# Patient Record
Sex: Female | Born: 1995 | Hispanic: Yes | Marital: Married | Smoking: Never smoker
Health system: Southern US, Community
[De-identification: ages and names within clinical notes are randomized; demographics above are authoritative.]

## PROBLEM LIST (undated history)

## (undated) ENCOUNTER — Inpatient Hospital Stay: Payer: Self-pay

## (undated) DIAGNOSIS — O99210 Obesity complicating pregnancy, unspecified trimester: Secondary | ICD-10-CM

## (undated) DIAGNOSIS — N83209 Unspecified ovarian cyst, unspecified side: Secondary | ICD-10-CM

## (undated) HISTORY — PX: TYMPANOSTOMY TUBE PLACEMENT: SHX32

---
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
## 2015-01-27 ENCOUNTER — Ambulatory Visit
Admission: RE | Admit: 2015-01-27 | Discharge: 2015-01-27 | Disposition: A | Discharge status: Home / Self Care | Payer: No Typology Code available for payment source | Source: Ambulatory Visit | Attending: Physician Assistant | Admitting: Physician Assistant

## 2015-01-27 ENCOUNTER — Other Ambulatory Visit: Payer: Self-pay | Admitting: Physician Assistant

## 2015-01-27 DIAGNOSIS — Z3A19 19 weeks gestation of pregnancy: Secondary | ICD-10-CM | POA: Insufficient documentation

## 2015-01-27 DIAGNOSIS — O368920 Maternal care for other specified fetal problems, second trimester, not applicable or unspecified: Secondary | ICD-10-CM | POA: Insufficient documentation

## 2015-01-27 DIAGNOSIS — Z3402 Encounter for supervision of normal first pregnancy, second trimester: Secondary | ICD-10-CM

## 2015-01-27 DIAGNOSIS — Z3490 Encounter for supervision of normal pregnancy, unspecified, unspecified trimester: Secondary | ICD-10-CM

## 2015-01-27 DIAGNOSIS — Z36 Encounter for antenatal screening of mother: Secondary | ICD-10-CM | POA: Diagnosis present

## 2015-03-16 ENCOUNTER — Observation Stay
Admission: EM | Admit: 2015-03-16 | Discharge: 2015-03-16 | Disposition: A | Discharge status: Home / Self Care | Payer: No Typology Code available for payment source | Attending: Obstetrics and Gynecology | Admitting: Obstetrics and Gynecology

## 2015-03-16 ENCOUNTER — Observation Stay: Payer: No Typology Code available for payment source

## 2015-03-16 ENCOUNTER — Encounter: Payer: Self-pay | Admitting: *Deleted

## 2015-03-16 DIAGNOSIS — O26899 Other specified pregnancy related conditions, unspecified trimester: Secondary | ICD-10-CM

## 2015-03-16 DIAGNOSIS — R102 Pelvic and perineal pain: Secondary | ICD-10-CM

## 2015-03-16 DIAGNOSIS — R109 Unspecified abdominal pain: Secondary | ICD-10-CM | POA: Diagnosis present

## 2015-03-16 DIAGNOSIS — O26892 Other specified pregnancy related conditions, second trimester: Principal | ICD-10-CM | POA: Insufficient documentation

## 2015-03-16 HISTORY — DX: Unspecified ovarian cyst, unspecified side: N83.209

## 2015-03-16 LAB — URINALYSIS COMPLETE WITH MICROSCOPIC (ARMC ONLY)
BILIRUBIN URINE: NEGATIVE
Bacteria, UA: NONE SEEN
GLUCOSE, UA: 50 mg/dL — AB
HGB URINE DIPSTICK: NEGATIVE
Ketones, ur: NEGATIVE mg/dL
LEUKOCYTES UA: NEGATIVE
Nitrite: NEGATIVE
Protein, ur: NEGATIVE mg/dL
SPECIFIC GRAVITY, URINE: 1.009 (ref 1.005–1.030)
pH: 7 (ref 5.0–8.0)

## 2015-03-16 LAB — CBC
HCT: 36 % (ref 35.0–47.0)
Hemoglobin: 12.1 g/dL (ref 12.0–16.0)
MCH: 30.7 pg (ref 26.0–34.0)
MCHC: 33.7 g/dL (ref 32.0–36.0)
MCV: 91.1 fL (ref 80.0–100.0)
Platelets: 344 10*3/uL (ref 150–440)
RBC: 3.95 MIL/uL (ref 3.80–5.20)
RDW: 13.4 % (ref 11.5–14.5)
WBC: 14.4 10*3/uL — ABNORMAL HIGH (ref 3.6–11.0)

## 2015-03-16 LAB — BASIC METABOLIC PANEL
ANION GAP: 8 (ref 5–15)
BUN: 7 mg/dL (ref 6–20)
CALCIUM: 8.8 mg/dL — AB (ref 8.9–10.3)
CHLORIDE: 107 mmol/L (ref 101–111)
CO2: 22 mmol/L (ref 22–32)
Creatinine, Ser: 0.39 mg/dL — ABNORMAL LOW (ref 0.44–1.00)
Glucose, Bld: 78 mg/dL (ref 65–99)
Potassium: 3.6 mmol/L (ref 3.5–5.1)
SODIUM: 137 mmol/L (ref 135–145)

## 2015-03-16 MED ORDER — LACTATED RINGERS IV BOLUS (SEPSIS)
1000.0000 mL | Freq: Once | INTRAVENOUS | Status: AC
  Administered 2015-03-16: 1000 mL via INTRAVENOUS

## 2015-03-16 NOTE — OB Triage Note (Signed)
Complaining of left sided lower back pain, that started 03/14/2015 in the AM. Pain not relieved by Tylenol or position change. Laying on left side aggravates pain.

## 2015-03-16 NOTE — Progress Notes (Signed)
Reviewed discharge instructions with patient and family all verbalized understanding. Prescriptions given to patient. Ambulatory up discharge.

## 2015-03-16 NOTE — Progress Notes (Signed)
Kristen Sawyer [birth year] G1 P0 [redacted]w[redacted]d presents for 3 day h/o lower abd pain  . Left > right . + cramping . NO N/V / fever . No vaginal bleeding . Some dysuria  No LOF , no vaginal bleeding , PMHX none  pshx none  Ros ; unremarkable  Allergies NKDA MEds : pnv  Social no tobacco , no etoh  O;BP 128/78 mmHg  Pulse 116  Temp(Src) 97.8 F (36.6 C) (Oral)  Resp 16  LMP 09/12/2014 ABD soft non tender , no rebound  CtX none  cx : long closed  NST, normal appearing for 26 week , no decels, no ctx  Labs: wbc 14K , rest of cbc nl  ua : negative   metb - wnl   U/s pelvic :   A: lower abd pain  P:pelvic u/s today  If normal d/c home with norco 1 po q 6 hrs .  Follow up with [facility]

## 2015-03-16 NOTE — Discharge Instructions (Signed)

## 2015-03-18 LAB — URINE CULTURE

## 2015-04-29 ENCOUNTER — Other Ambulatory Visit: Payer: Self-pay | Admitting: Physician Assistant

## 2015-04-29 DIAGNOSIS — Z3403 Encounter for supervision of normal first pregnancy, third trimester: Secondary | ICD-10-CM

## 2015-04-29 DIAGNOSIS — O35EXX0 Maternal care for other (suspected) fetal abnormality and damage, fetal genitourinary anomalies, not applicable or unspecified: Secondary | ICD-10-CM

## 2015-04-29 DIAGNOSIS — O358XX0 Maternal care for other (suspected) fetal abnormality and damage, not applicable or unspecified: Secondary | ICD-10-CM

## 2015-05-05 ENCOUNTER — Ambulatory Visit
Admission: RE | Admit: 2015-05-05 | Discharge: 2015-05-05 | Disposition: A | Discharge status: Home / Self Care | Payer: No Typology Code available for payment source | Source: Ambulatory Visit | Attending: Physician Assistant | Admitting: Physician Assistant

## 2015-05-05 DIAGNOSIS — O358XX0 Maternal care for other (suspected) fetal abnormality and damage, not applicable or unspecified: Secondary | ICD-10-CM

## 2015-05-05 DIAGNOSIS — Z3403 Encounter for supervision of normal first pregnancy, third trimester: Secondary | ICD-10-CM | POA: Diagnosis present

## 2015-05-05 DIAGNOSIS — O35EXX0 Maternal care for other (suspected) fetal abnormality and damage, fetal genitourinary anomalies, not applicable or unspecified: Secondary | ICD-10-CM

## 2015-05-07 ENCOUNTER — Other Ambulatory Visit: Payer: Self-pay | Admitting: Physician Assistant

## 2015-05-07 DIAGNOSIS — IMO0002 Reserved for concepts with insufficient information to code with codable children: Secondary | ICD-10-CM

## 2015-05-07 DIAGNOSIS — N1330 Unspecified hydronephrosis: Secondary | ICD-10-CM

## 2015-05-20 ENCOUNTER — Ambulatory Visit
Admission: RE | Admit: 2015-05-20 | Discharge: 2015-05-20 | Disposition: A | Discharge status: Home / Self Care | Payer: No Typology Code available for payment source | Source: Ambulatory Visit | Attending: Physician Assistant | Admitting: Physician Assistant

## 2015-05-20 VITALS — BP 130/83 | HR 109 | Temp 97.9°F | Resp 18 | Ht 60.0 in | Wt 167.4 lb

## 2015-05-20 DIAGNOSIS — IMO0001 Reserved for inherently not codable concepts without codable children: Secondary | ICD-10-CM

## 2015-05-20 DIAGNOSIS — Z3A35 35 weeks gestation of pregnancy: Secondary | ICD-10-CM

## 2015-05-20 DIAGNOSIS — O359XX0 Maternal care for (suspected) fetal abnormality and damage, unspecified, not applicable or unspecified: Secondary | ICD-10-CM | POA: Diagnosis present

## 2015-05-20 DIAGNOSIS — N1330 Unspecified hydronephrosis: Secondary | ICD-10-CM

## 2015-05-20 DIAGNOSIS — O358XX0 Maternal care for other (suspected) fetal abnormality and damage, not applicable or unspecified: Secondary | ICD-10-CM

## 2015-05-20 DIAGNOSIS — IMO0002 Reserved for concepts with insufficient information to code with codable children: Secondary | ICD-10-CM

## 2015-05-20 HISTORY — DX: Obesity complicating pregnancy, unspecified trimester: O99.210

## 2016-01-19 ENCOUNTER — Encounter (HOSPITAL_COMMUNITY): Payer: Self-pay | Admitting: *Deleted

## 2016-03-22 IMAGING — US US OB LIMITED
1 series · 14 of 28 positions shown · non-contrast
Comparison: none

CLINICAL DATA: Abdominal pain affecting pregnancy. Assigned
gestational age of 26 weeks 3 days .

EXAM:
LIMITED OBSTETRIC ULTRASOUND

[Series 1: us ob limited · 0.27mm/px · 14 of 48 slices shown]
[im 2/48]
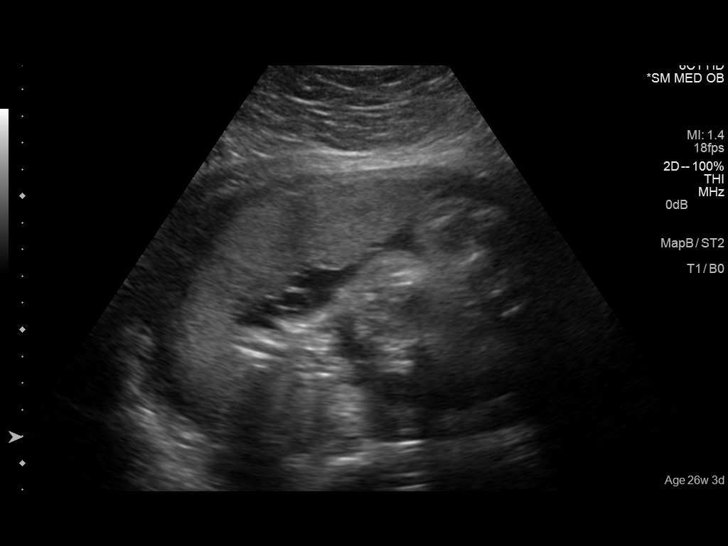
[im 6/48]
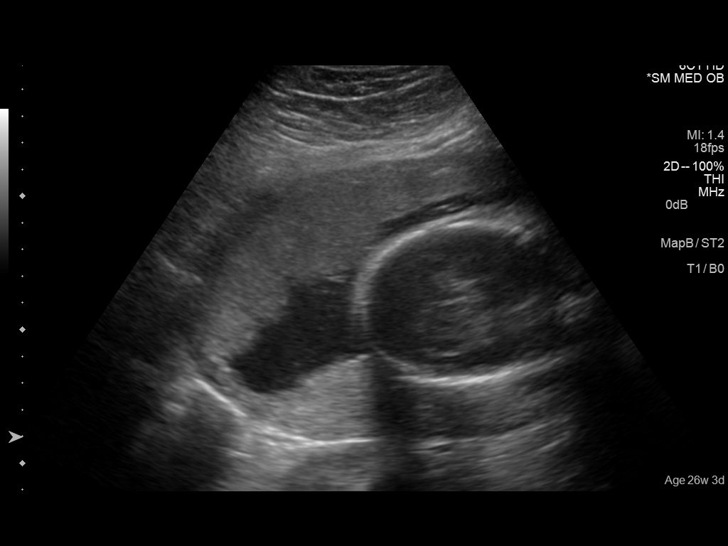
[im 9/48]
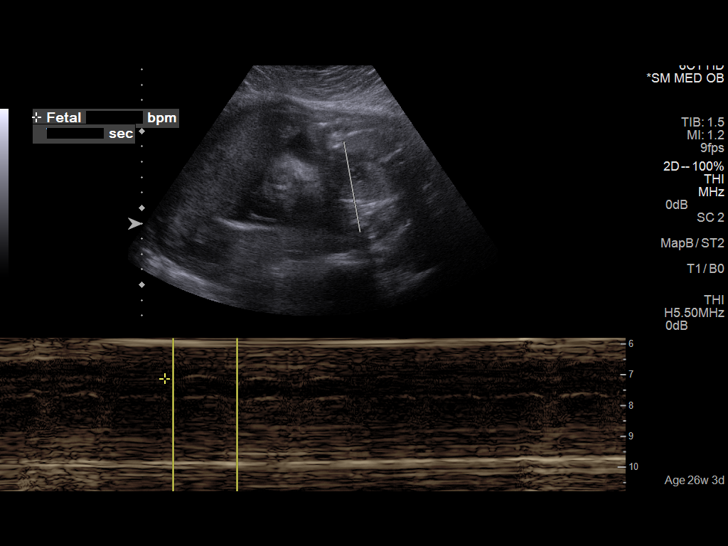
[im 13/48]
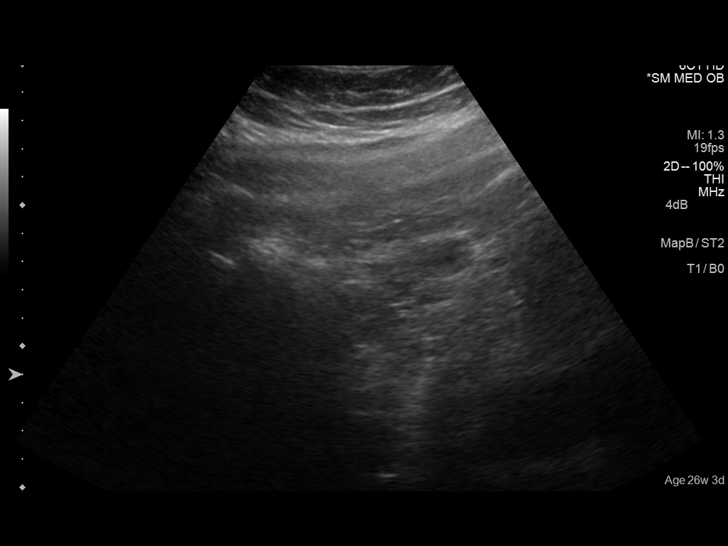
[im 16/48]
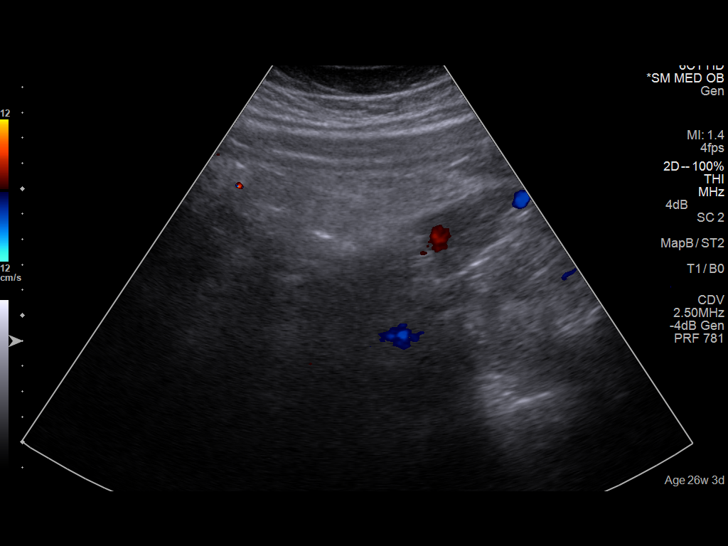
[im 20/48]
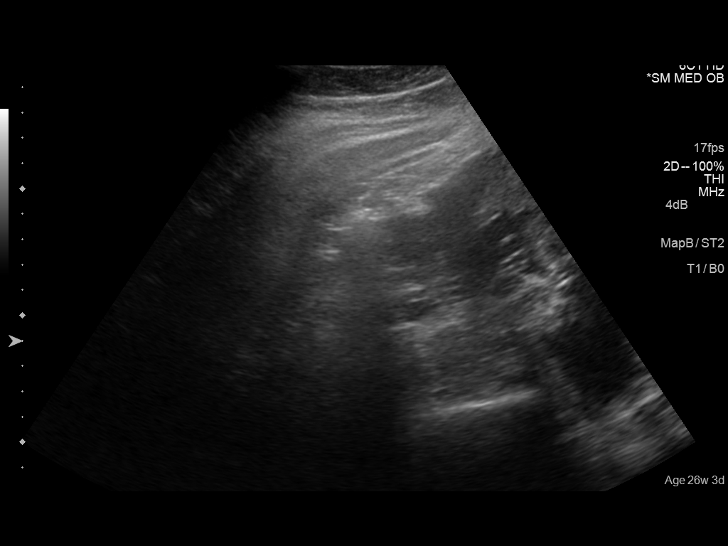
[im 23/48]
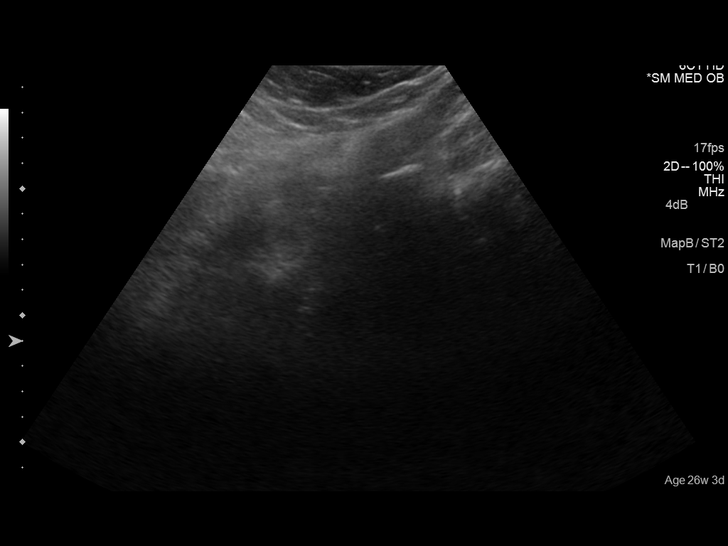
[im 27/48]
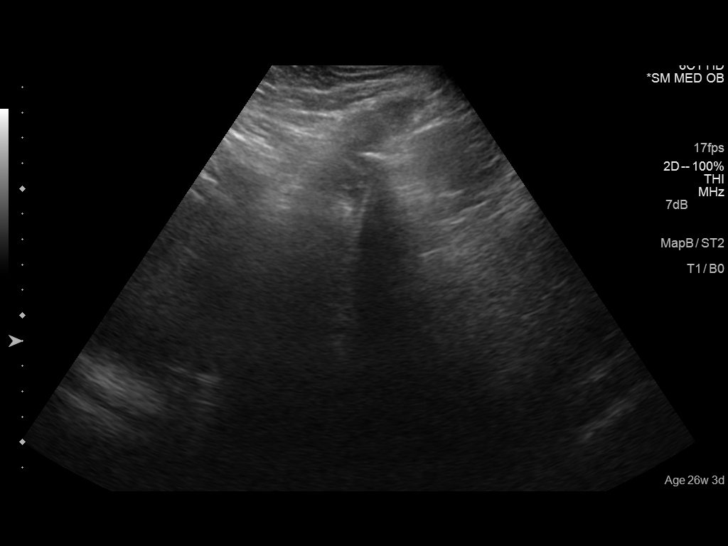
[im 30/48]
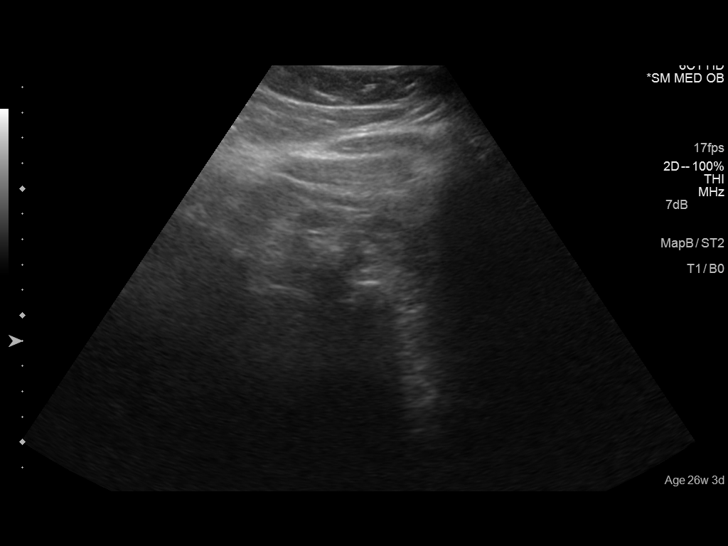
[im 34/48]
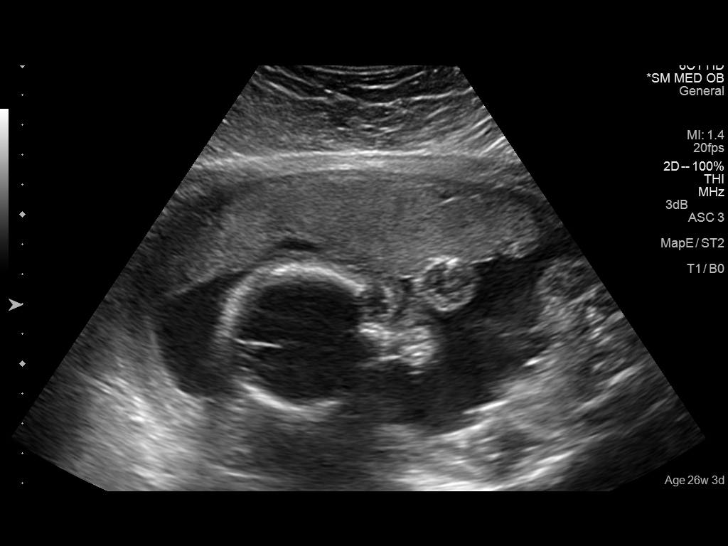
[im 37/48]
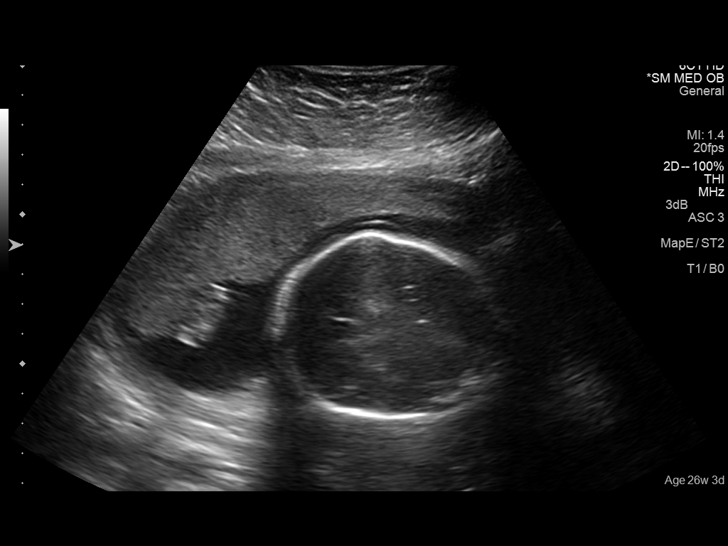
[im 41/48]
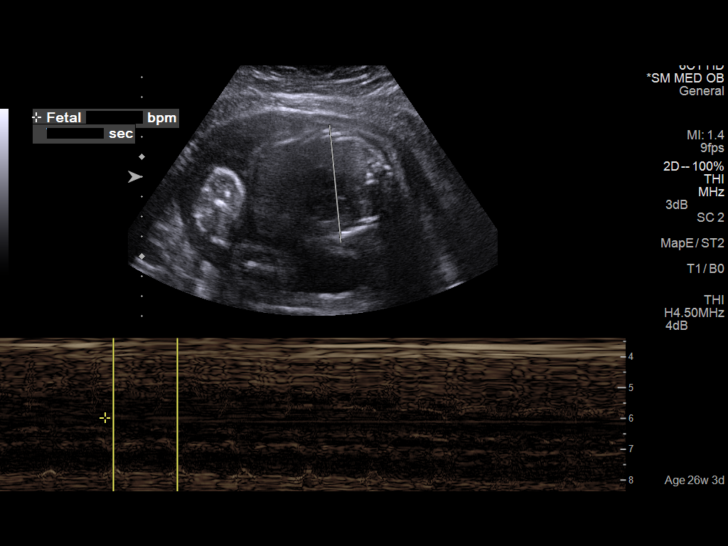
[im 44/48]
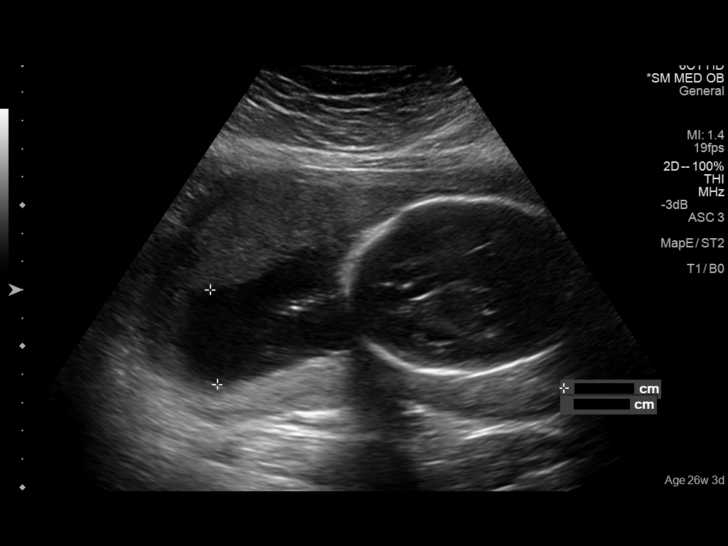
[im 48/48]
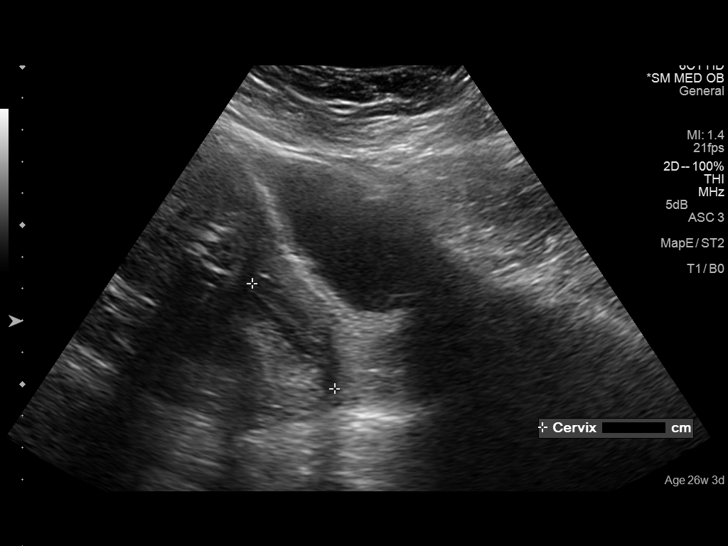

[14 of 28 positions shown; findings below may reference images not displayed]

FINDINGS: Number of Fetuses: 1

Heart Rate:  160 bpm

Movement: Yes

Presentation: Breech

Placental Location: Anterior

Previa: No

Amniotic Fluid (Subjective):  Within normal limits.

BPD:  6.0cm 24w  3d

MATERNAL FINDINGS:

Cervix:  Appears closed.  Measures 4.2 cm in length.

Uterus/Adnexae:  No abnormality visualized.
IMPRESSION: Single living intrauterine fetus in breech presentation. No acute
maternal findings visualized.

This exam is performed on an emergent basis and does not
comprehensively evaluate fetal size, dating, or anatomy; follow-up
complete OB US should be considered if further fetal assessment is
warranted.

## 2016-05-26 IMAGING — US US MFM OB FOLLOW-UP
1 series · 14 of 28 positions shown · non-contrast
Comparison: none

[Series 1: us mfm ob follow-up · 0.31mm/px · 14 of 50 slices shown]
[im 2/50]
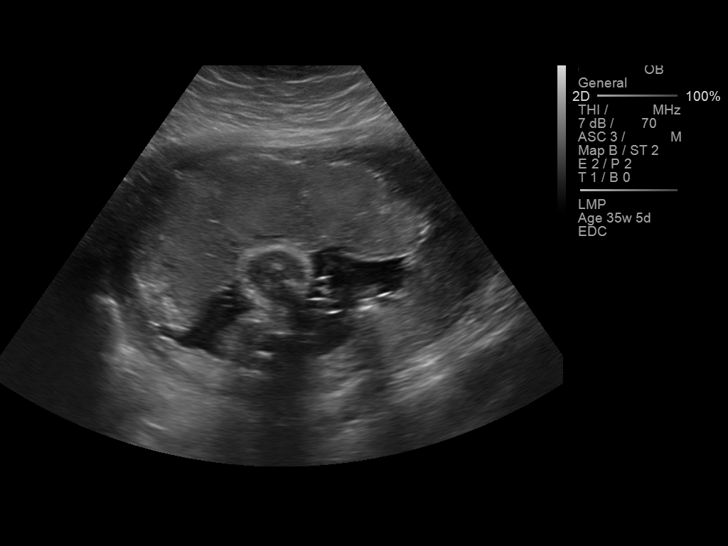
[im 6/50]
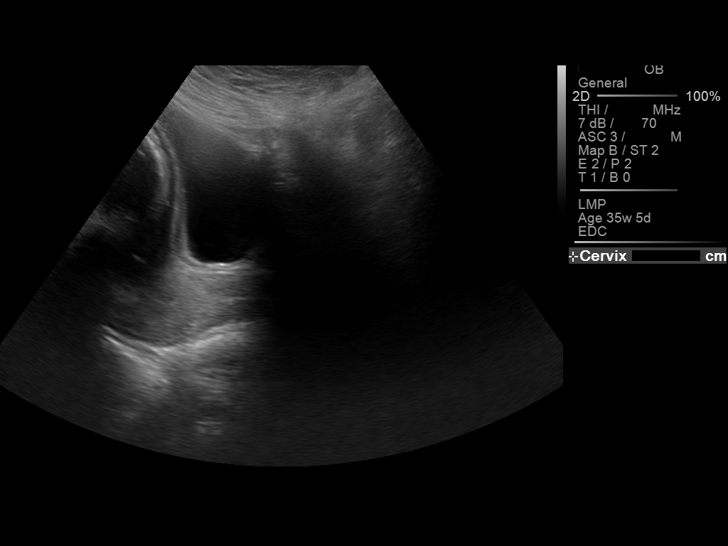
[im 10/50]
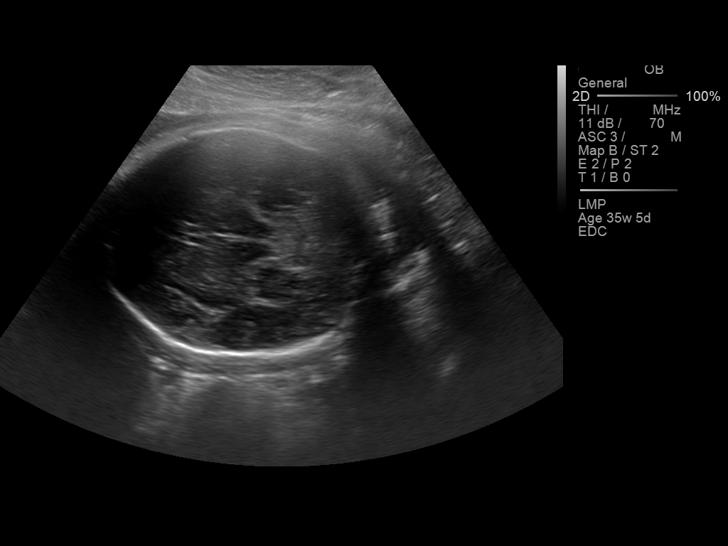
[im 13/50]
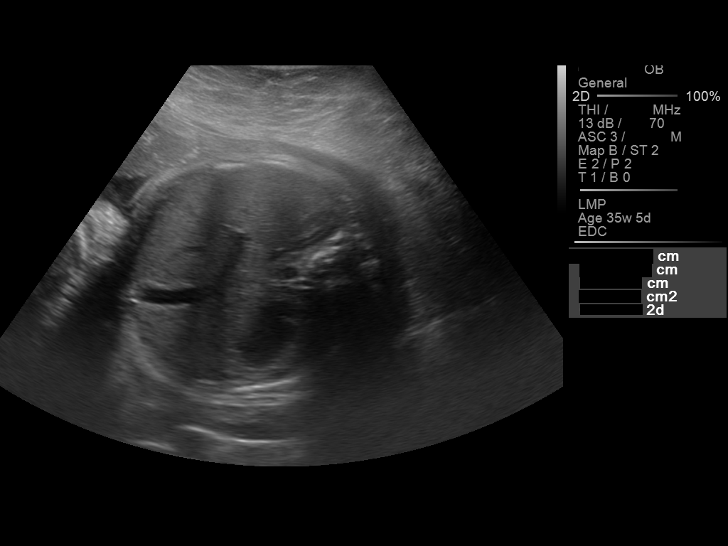
[im 17/50]
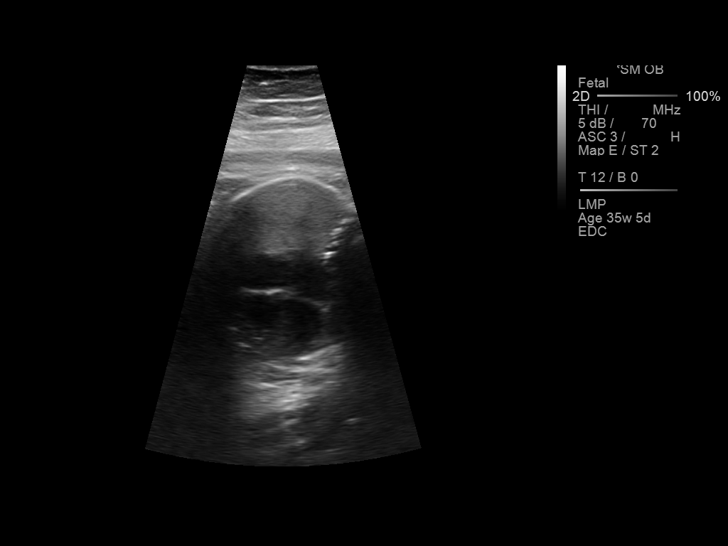
[im 20/50]
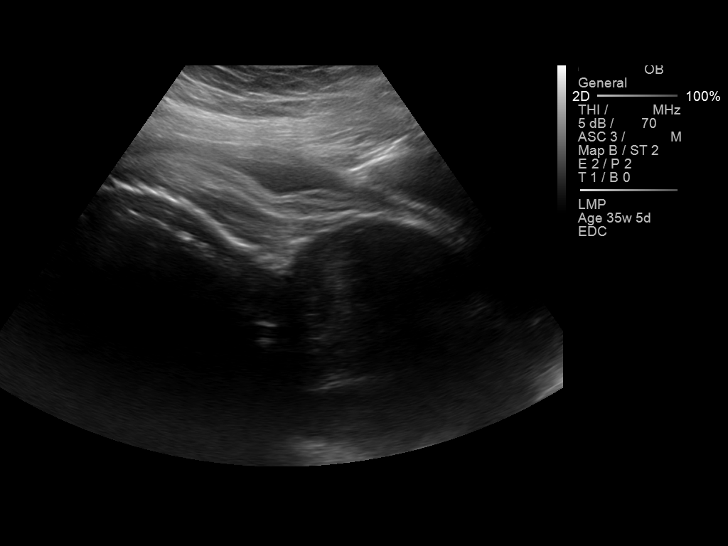
[im 24/50]
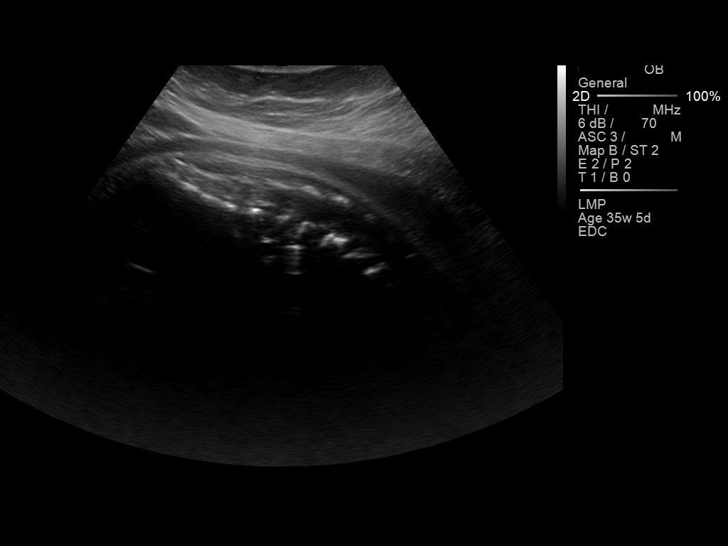
[im 28/50]
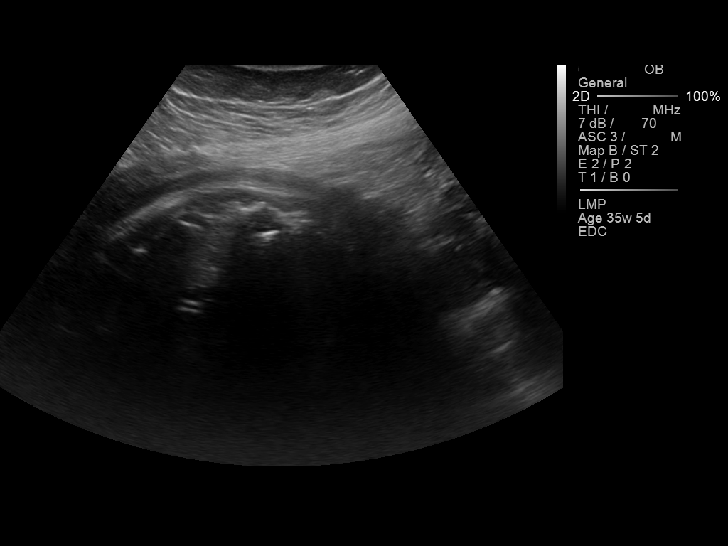
[im 31/50]
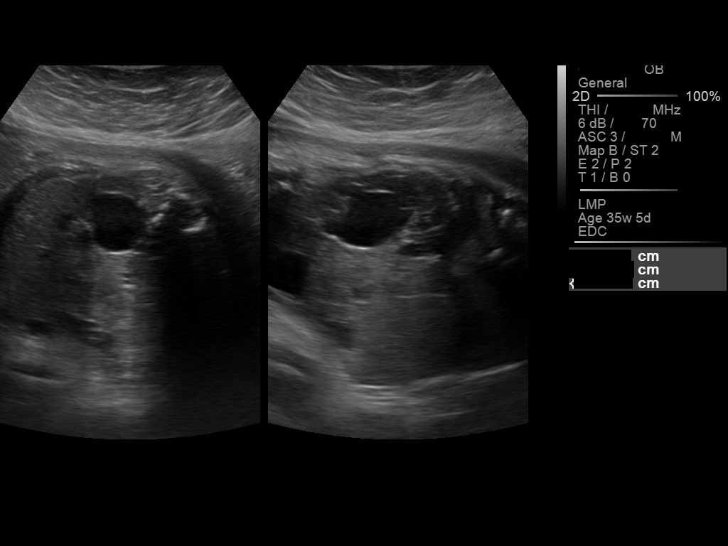
[im 35/50]
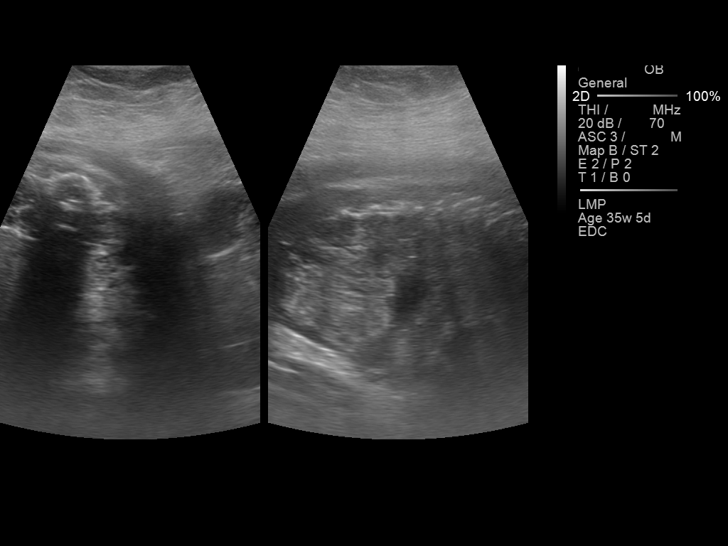
[im 39/50]
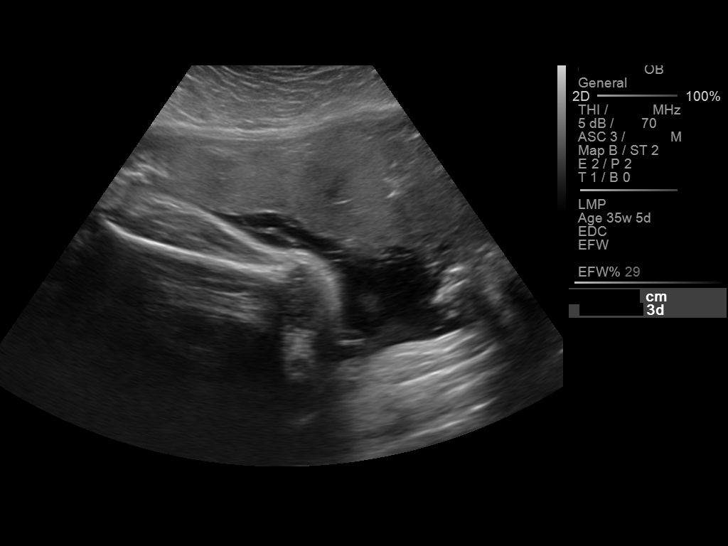
[im 42/50]
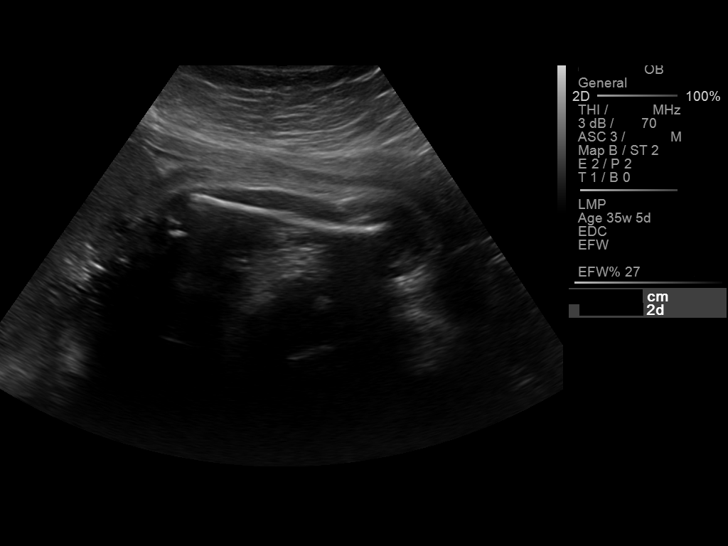
[im 46/50]
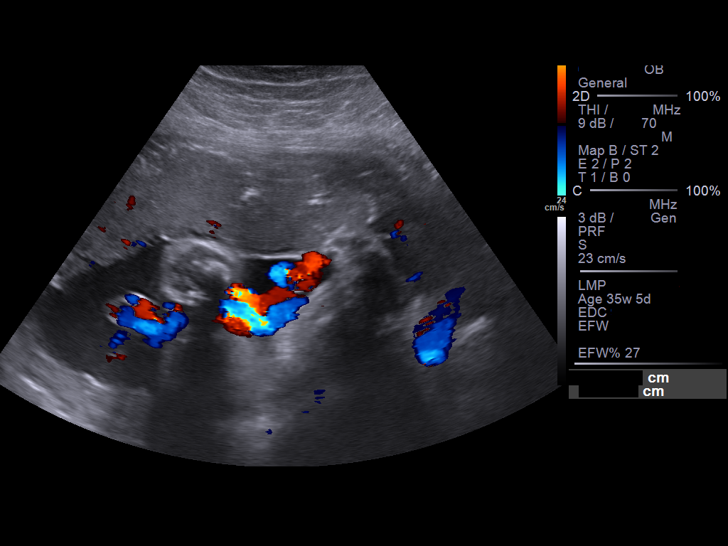
[im 50/50]
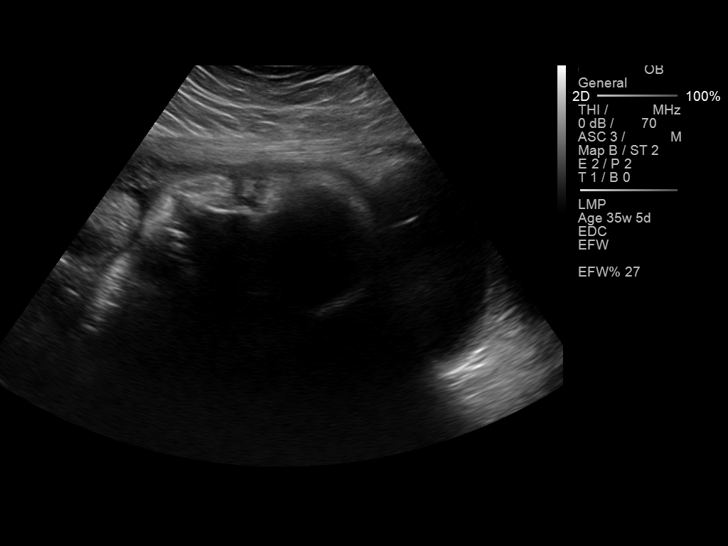

[14 of 28 positions shown; findings below may reference images not displayed]

Canned report from images found in remote index.

Refer to host system for actual result text.

## 2018-05-19 ENCOUNTER — Encounter: Payer: Self-pay | Admitting: Emergency Medicine

## 2018-05-19 ENCOUNTER — Emergency Department
Admission: EM | Admit: 2018-05-19 | Discharge: 2018-05-19 | Disposition: A | Discharge status: Home / Self Care | Payer: Medicaid Other | Attending: Emergency Medicine | Admitting: Emergency Medicine

## 2018-05-19 ENCOUNTER — Other Ambulatory Visit: Payer: Self-pay

## 2018-05-19 ENCOUNTER — Emergency Department: Payer: Medicaid Other

## 2018-05-19 DIAGNOSIS — Z3A01 Less than 8 weeks gestation of pregnancy: Secondary | ICD-10-CM | POA: Insufficient documentation

## 2018-05-19 DIAGNOSIS — O209 Hemorrhage in early pregnancy, unspecified: Secondary | ICD-10-CM | POA: Diagnosis present

## 2018-05-19 DIAGNOSIS — O4690 Antepartum hemorrhage, unspecified, unspecified trimester: Secondary | ICD-10-CM

## 2018-05-19 DIAGNOSIS — Z79899 Other long term (current) drug therapy: Secondary | ICD-10-CM | POA: Insufficient documentation

## 2018-05-19 DIAGNOSIS — R1030 Lower abdominal pain, unspecified: Secondary | ICD-10-CM | POA: Diagnosis not present

## 2018-05-19 LAB — CBC
HCT: 39.3 % (ref 35.0–47.0)
Hemoglobin: 14 g/dL (ref 12.0–16.0)
MCH: 32.4 pg (ref 26.0–34.0)
MCHC: 35.6 g/dL (ref 32.0–36.0)
MCV: 90.8 fL (ref 80.0–100.0)
Platelets: 324 10*3/uL (ref 150–440)
RBC: 4.32 MIL/uL (ref 3.80–5.20)
RDW: 14 % (ref 11.5–14.5)
WBC: 10.5 10*3/uL (ref 3.6–11.0)

## 2018-05-19 LAB — ABO/RH: ABO/RH(D): A POS

## 2018-05-19 LAB — HCG, QUANTITATIVE, PREGNANCY: HCG, BETA CHAIN, QUANT, S: 1765 m[IU]/mL — AB (ref <5)

## 2018-05-19 MED ORDER — SODIUM CHLORIDE 0.9 % IV BOLUS
1000.0000 mL | Freq: Once | INTRAVENOUS | Status: AC
  Administered 2018-05-19: 1000 mL via INTRAVENOUS

## 2018-05-19 MED ORDER — KETOROLAC TROMETHAMINE 30 MG/ML IJ SOLN
15.0000 mg | Freq: Once | INTRAMUSCULAR | Status: AC
  Administered 2018-05-19: 15 mg via INTRAVENOUS
  Filled 2018-05-19: qty 1

## 2018-05-19 NOTE — ED Notes (Signed)
Pt presents with bleeding at [redacted] weeks pregnant. States it began as spotting yesterday and she felt a large "gush" earlier today and is bleeding heavily. Pt tearful.

## 2018-05-19 NOTE — ED Notes (Signed)
Resent lav and light green tops to lab at this time.

## 2018-05-19 NOTE — ED Notes (Signed)
Pt taken to US via stretcher

## 2018-05-19 NOTE — ED Notes (Addendum)
Pt states spotting yesterday, cramping intermittently. States today at 2p started to have heavier bleeding. States a pad per hour. [redacted] weeks pregnant. Sees Phineas Realharles Drew for [employer]. 2nd pregnancy, 1 living child. Denies feeling dizzy at this time. Alert, oriented, visitor at bedside. Speaking in complete sentences. Given tissues.

## 2018-05-19 NOTE — ED Triage Notes (Signed)
Pt states is a G2L1, [redacted] weeks pregnant, states started having vaginal cramping and spotting yesterday, initially today lessened and approx 10 minutes ago "it just started gushing". Pt presents to ED visibly tearful.

## 2018-05-19 NOTE — ED Notes (Signed)
Pt continues to pass clots. Cleaned with warm wipes.

## 2018-05-19 NOTE — ED Notes (Signed)
Pt called out, stated she had passed a clot. Pt cleaned again of blood. Clean chuck placed under pt.

## 2018-05-19 NOTE — ED Provider Notes (Signed)
[facility] Emergency Department Provider Note  ____________________________________________  Time seen: Approximately 3:14 PM  I have reviewed the triage vital signs and the nursing notes.   HISTORY  Chief Complaint Vaginal Bleeding   HPI Kristen Sawyer is a 22 y.o. female G2P1 currently at [redacted] weeks gestational age per LMP who presents for evaluation of vaginal bleeding.  Patient reports lower abdominal cramping and mild vaginal spotting that started yesterday.  Today she started having heavy vaginal bleeding and passing clots.  She denies dizziness, chest pain or shortness of breath.  She denies history of anemia.  She reports intermittent lower abdominal cramping which are moderate in intensity but none at this time.  She has not had an ultrasound to establish care for this pregnancy yet.  Past Medical History:  Diagnosis Date  . Obesity affecting pregnancy 2016  . Ovarian cyst    bilateral    Patient Active Problem List   Diagnosis Date Noted  . [redacted] weeks gestation of pregnancy 05/20/2015  . 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 Abdominal discomfort 03/16/2015    Past Surgical History:  Procedure Laterality Date  . TYMPANOSTOMY TUBE PLACEMENT Bilateral     Prior to Admission medications   Medication Sig Start Date End Date Taking? Authorizing Provider  Prenatal Vit-Fe Fumarate-FA (PRENATAL MULTIVITAMIN) TABS tablet Take 1 tablet by mouth daily at 12 noon.   Yes [provider]    Allergies Patient has no known allergies.  No family history on file.  Social History Social History   Tobacco Use  . Smoking status: Never Smoker  . Smokeless tobacco: Never Used  Substance Use Topics  . Alcohol use: No  . Drug use: No    Review of Systems  Constitutional: Negative for fever. Eyes: Negative for visual changes. ENT: Negative for sore throat. Neck: No neck pain  Cardiovascular: Negative for chest pain. Respiratory: Negative  for shortness of breath. Gastrointestinal: + lower abdominal pain. No vomiting or diarrhea. Genitourinary: Negative for dysuria. + vaginal bleeding Musculoskeletal: Negative for back pain. Skin: Negative for rash. Neurological: Negative for headaches, weakness or numbness. Psych: No SI or HI  ____________________________________________   PHYSICAL EXAM:  VITAL SIGNS: ED Triage Vitals [05/19/18 1405]  Enc Vitals Group     BP (!) 160/86     Pulse Rate (!) 116     Resp 20     Temp 98.1 F (36.7 C)     Temp Source Oral     SpO2 98 %     Weight 185 lb (83.9 kg)     Height 5' (1.524 m)     Head Circumference      Peak Flow      Pain Score 5     Pain Loc      Pain Edu?      Excl. in GC?     Constitutional: Alert and oriented. Well appearing and in no apparent distress. HEENT:      Head: Normocephalic and atraumatic.         Eyes: Conjunctivae are normal. Sclera is non-icteric.       Mouth/Throat: Mucous membranes are moist.       Neck: Supple with no signs of meningismus. Cardiovascular: Tachycardic with regular rhythm. No murmurs, gallops, or rubs. 2+ symmetrical distal pulses are present in all extremities. No JVD. Respiratory: Normal respiratory effort. Lungs are clear to auscultation bilaterally. No wheezes, crackles, or rhonchi.  Gastrointestinal: Soft, non tender, and non distended with positive  bowel sounds. No rebound or guarding. Pelvic exam: Normal external genitalia, no rashes or lesions. Os closed with clots in the vaginal vault which were evacuated , no active bleeding from the os or gestational sac seen. No cervical motion tenderness.  No uterine or adnexal tenderness.   Musculoskeletal: Nontender with normal range of motion in all extremities. No edema, cyanosis, or erythema of extremities. Neurologic: Normal speech and language. Face is symmetric. Moving all extremities. No gross focal neurologic deficits are appreciated. Skin: Skin is warm, dry and intact. No  rash noted. Psychiatric: Mood and affect are normal. Speech and behavior are normal.  ____________________________________________   LABS (all labs ordered are listed, but only abnormal results are displayed)  Labs Reviewed  HCG, QUANTITATIVE, PREGNANCY - Abnormal; Notable for the following components:      Result Value   hCG, Beta Chain, Quant, S 1,765 (*)    All other components within normal limits  CBC  ABO/RH   ____________________________________________  EKG  none  ____________________________________________  RADIOLOGY  I have personally reviewed the images performed during this visit and I agree with the Radiologist's read.   Interpretation by Radiologist:  [place] Ob Comp Less 14 Wks  Result Date: 05/19/2018 CLINICAL DATA:  Pregnant, cramping, vaginal bleeding EXAM: OBSTETRIC <14 WK US AND TRANSVAGINAL OB US TECHNIQUE: Both transabdominal and transvaginal ultrasound examinations were performed for complete evaluation of the gestation as well as the maternal uterus, adnexal regions, and pelvic cul-de-sac. Transvaginal technique was performed to assess early pregnancy. COMPARISON:  None. FINDINGS: Intrauterine gestational sac: None Yolk sac:  Not Visualized. Embryo:  Not Visualized. Maternal uterus/adnexae: Endometrium is thickened/heterogeneous, measuring 2.0 cm. Bilateral ovaries are within normal limits. No free fluid. IMPRESSION: No IUP is visualized. Endometrial complex is thickened/heterogeneous, measuring 2.0 cm. By definition, in the setting of a positive pregnancy test, this reflects a pregnancy of unknown location. Differential considerations include early normal IUP, abnormal IUP/missed abortion (favored), or nonvisualized ectopic pregnancy. Serial beta HCG is suggested. Consider repeat pelvic ultrasound in 14 days, as clinically warranted. Electronically Signed   By: Charline BillsSriyesh  Krishnan M.D.   On: 05/19/2018 17:20   [place] Ob Transvaginal  Result Date: 05/19/2018 CLINICAL  DATA:  Pregnant, cramping, vaginal bleeding EXAM: OBSTETRIC <14 WK US AND TRANSVAGINAL OB US TECHNIQUE: Both transabdominal and transvaginal ultrasound examinations were performed for complete evaluation of the gestation as well as the maternal uterus, adnexal regions, and pelvic cul-de-sac. Transvaginal technique was performed to assess early pregnancy. COMPARISON:  None. FINDINGS: Intrauterine gestational sac: None Yolk sac:  Not Visualized. Embryo:  Not Visualized. Maternal uterus/adnexae: Endometrium is thickened/heterogeneous, measuring 2.0 cm. Bilateral ovaries are within normal limits. No free fluid. IMPRESSION: No IUP is visualized. Endometrial complex is thickened/heterogeneous, measuring 2.0 cm. By definition, in the setting of a positive pregnancy test, this reflects a pregnancy of unknown location. Differential considerations include early normal IUP, abnormal IUP/missed abortion (favored), or nonvisualized ectopic pregnancy. Serial beta HCG is suggested. Consider repeat pelvic ultrasound in 14 days, as clinically warranted. Electronically Signed   By: Charline BillsSriyesh  Krishnan M.D.   On: 05/19/2018 17:20      ____________________________________________   PROCEDURES  Procedure(s) performed: None Procedures Critical Care performed:  None ____________________________________________   INITIAL IMPRESSION / ASSESSMENT AND PLAN / ED COURSE  22 y.o. female G2P1 currently at 708 weeks gestational age per LMP who presents for evaluation of vaginal bleeding and lower abdominal cramping.  Patient with active vaginal bleeding, she is tachycardic, normotensive, pelvic exam showing  closed office with large blood clots in the vaginal vault which were evacuated, no gestational sac seen and no active bleeding from the office seen at this time.  Presentation concerning for miscarriage versus subchorionic hemorrhage versus ectopic pregnancy.  Transvaginal ultrasound is pending.  Will check labs to rule out acute  blood loss anemia, ABO Rh to evaluate for need of RhoGam.  Will give IV fluids for tachycardia at this time.  Clinical Course as of May 19 1757  Wynelle Link May 19, 2018  1752 hCG of 1765.  Ultrasound showing thickened endometrium with no IUP.  Differential diagnoses including missed abortion versus ongoing miscarriage versus ectopic pregnancy.  Recommend follow-up with her OB/GYN in 48 hours for repeat hCG.  Discussed return precautions for signs or symptoms of acute blood loss anemia.   [CV]    Clinical Course User Index [CV] Don Perking [place], MD     As part of my medical decision making, I reviewed the following data within the electronic MEDICAL RECORD NUMBER Nursing notes reviewed and incorporated, Labs reviewed , Old chart reviewed, Radiograph reviewed , Notes from prior ED visits and [place] Controlled Substance Database    Pertinent labs & imaging results that were available during my care of the patient were reviewed by me and considered in my medical decision making (see chart for details).    ____________________________________________   FINAL CLINICAL IMPRESSION(S) / ED DIAGNOSES  Final diagnoses:  Vaginal bleeding in pregnancy      NEW MEDICATIONS STARTED DURING THIS VISIT:  ED Discharge Orders    None       Note:  This document was prepared using Dragon voice recognition software and may include unintentional dictation errors.    Don Perking, [place], MD 05/19/18 1758

## 2018-05-19 NOTE — Discharge Instructions (Addendum)
At this time your ultrasound is concerning for an ongoing miscarriage versus a pregnancy outside of the uterus. It is very important that you follow-up with your OB/GYN in 48 hours for repeat pregnancy hormones since a pregnancy outside the uterus can be life-threatening.  If this is a miscarriage he should expect a heavier than normal menstrual period.  If you are feeling dizzy, very weak, pale, has shortness of breath or passes out these are signs of significant blood loss and you should return to the emergency room immediately for evaluation.

## 2018-05-19 NOTE — ED Notes (Signed)
Pt ambulatory to toilet with steady gait noted. Denies dizziness while walking.

## 2018-05-19 NOTE — ED Notes (Signed)
Dr. Don PerkingVeronese performed a pelvic exam, clots noted with trickle of blood, suctioned. Pt had dried blood on legs, was cleaned with warm wipes. Pt very anxious. Family member remains at bedside. Clean chucks placed under pt.

## 2019-03-06 IMAGING — US US OB TRANSVAGINAL
1 series · 14 of 28 positions shown · non-contrast
Comparison: None.

CLINICAL DATA: Pregnant, cramping, vaginal bleeding

EXAM:
OBSTETRIC <14 WK US AND TRANSVAGINAL OB US
TECHNIQUE: Both transabdominal and transvaginal ultrasound examinations were
performed for complete evaluation of the gestation as well as the
maternal uterus, adnexal regions, and pelvic cul-de-sac.
Transvaginal technique was performed to assess early pregnancy.

[Series 1: us ob transvaginal · 14 of 81 slices shown]
[im 3/81]
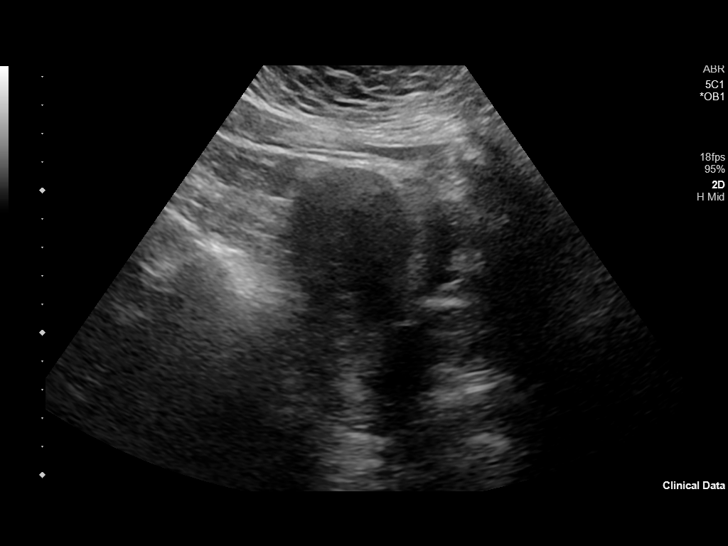
[im 9/81]
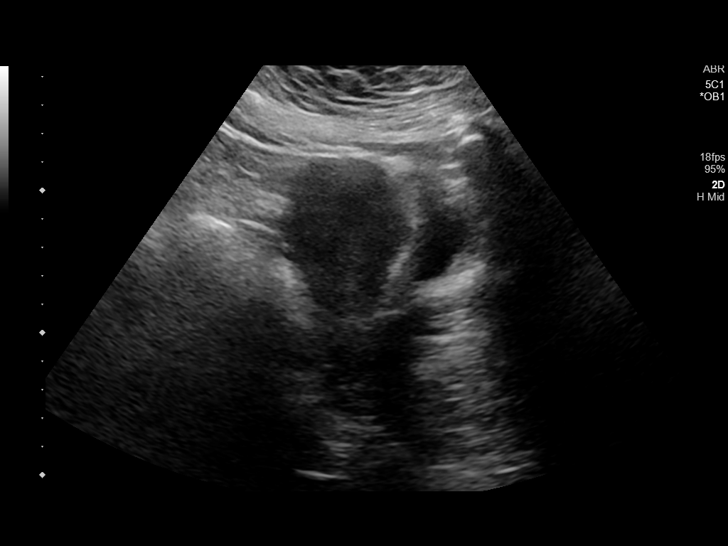
[im 15/81]
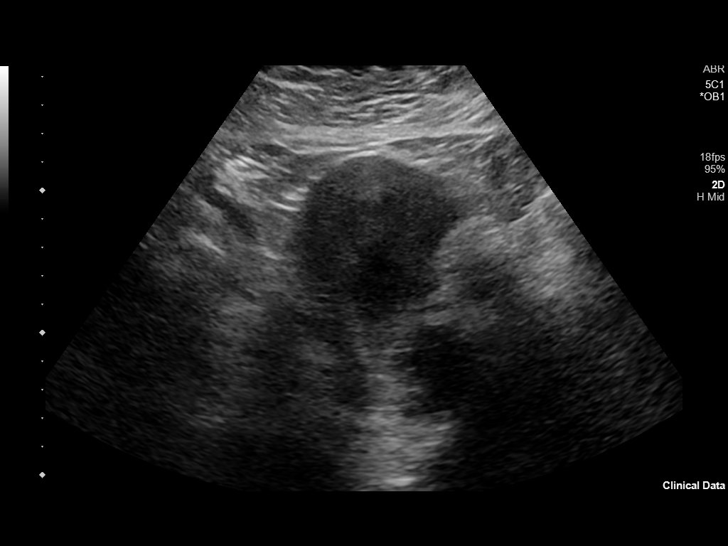
[im 21/81]
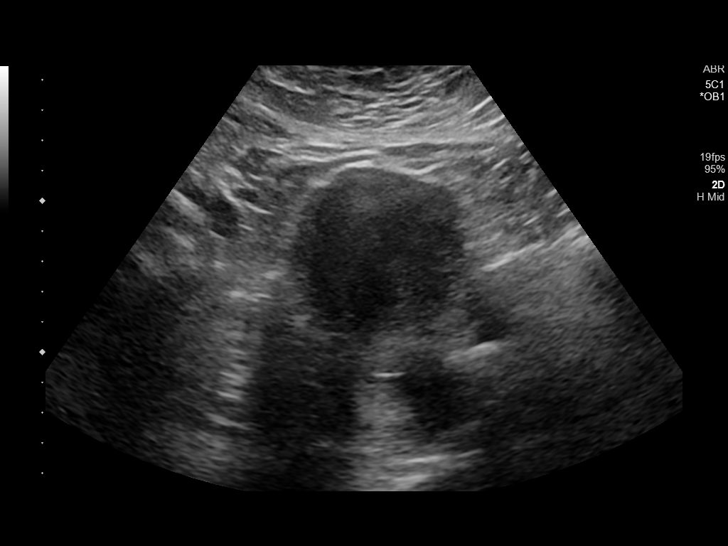
[im 27/81]
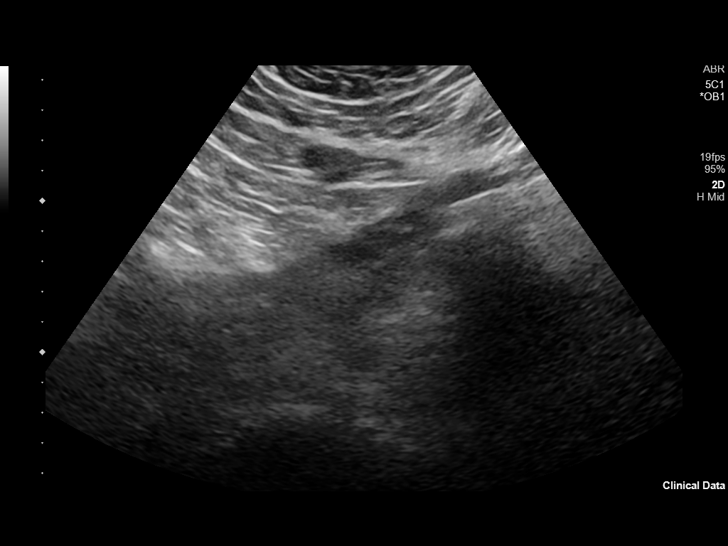
[im 33/81]
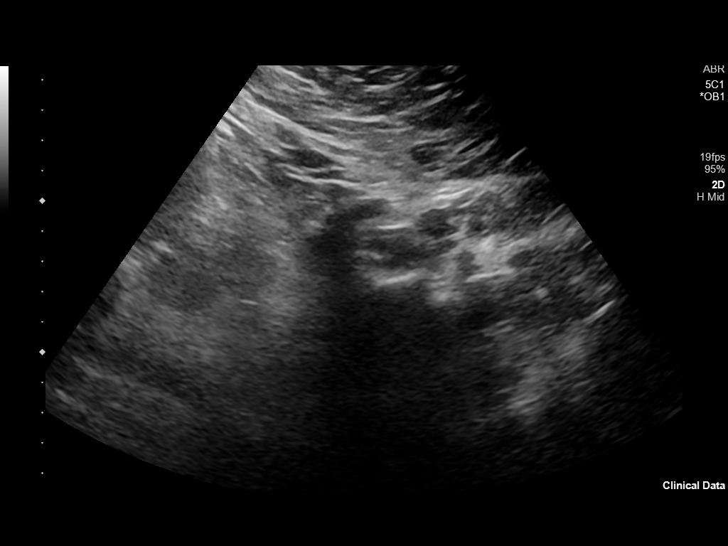
[im 39/81]
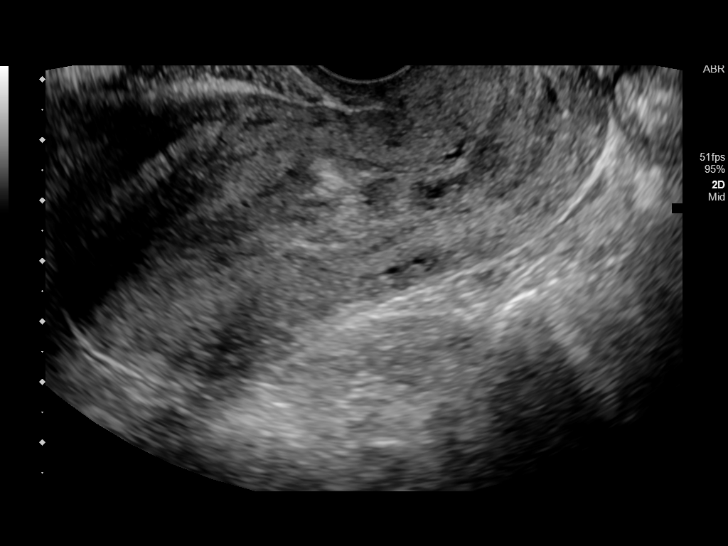
[im 45/81]
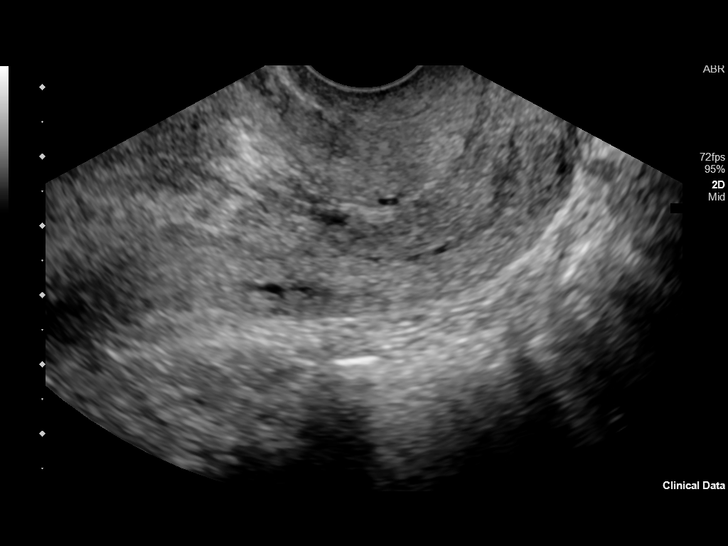
[im 51/81]
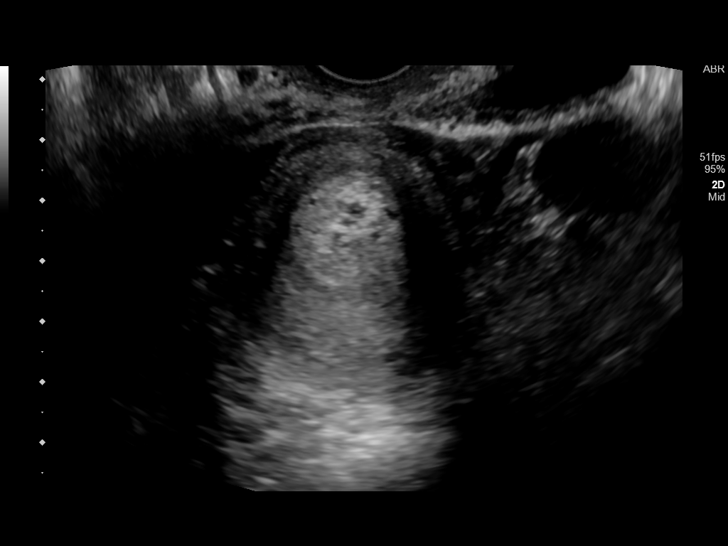
[im 57/81]
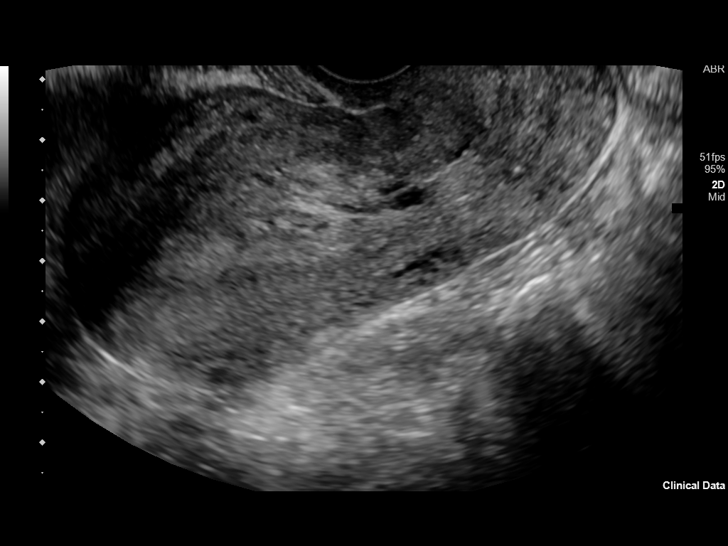
[im 63/81]
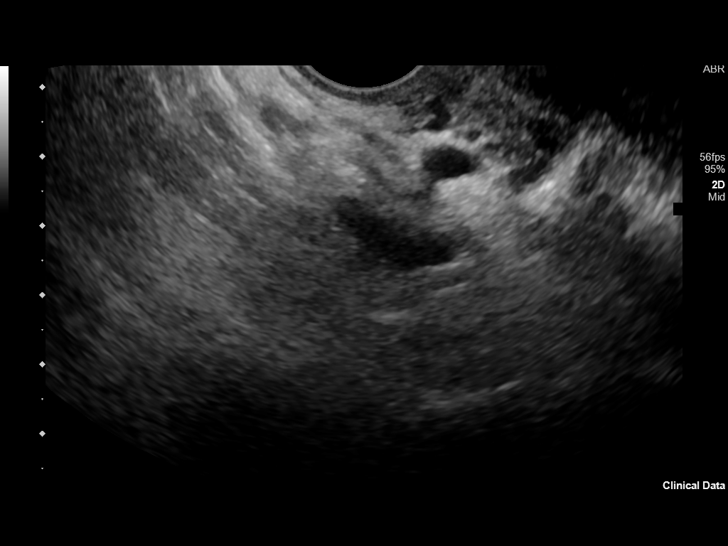
[im 69/81]
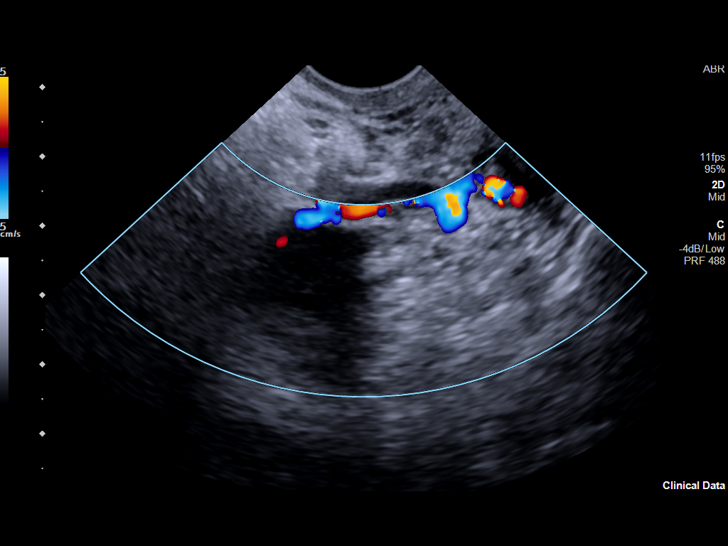
[im 75/81]
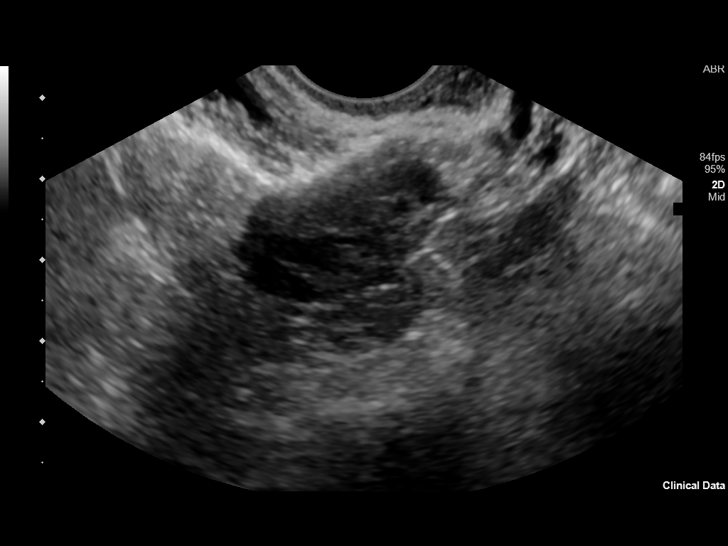
[im 81/81]
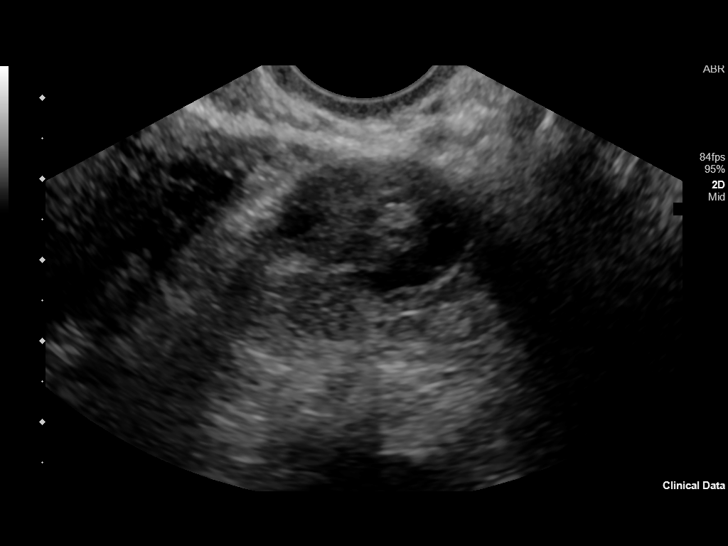

[14 of 28 positions shown; findings below may reference images not displayed]

FINDINGS: Intrauterine gestational sac: None

Yolk sac:  Not Visualized.

Embryo:  Not Visualized.

Maternal uterus/adnexae: Endometrium is thickened/heterogeneous,
measuring 2.0 cm.

Bilateral ovaries are within normal limits.

No free fluid.
IMPRESSION: No IUP is visualized. Endometrial complex is
thickened/heterogeneous, measuring 2.0 cm.

By definition, in the setting of a positive pregnancy test, this
reflects a pregnancy of unknown location. Differential
considerations include early normal IUP, abnormal IUP/missed
abortion (favored), or nonvisualized ectopic pregnancy.

Serial beta HCG is suggested. Consider repeat pelvic ultrasound in
14 days, as clinically warranted.

## 2020-01-09 ENCOUNTER — Ambulatory Visit: Payer: No Typology Code available for payment source | Attending: Internal Medicine

## 2020-01-09 ENCOUNTER — Other Ambulatory Visit: Payer: Self-pay

## 2020-01-09 DIAGNOSIS — Z23 Encounter for immunization: Secondary | ICD-10-CM

## 2020-01-09 NOTE — Progress Notes (Signed)
   Covid-19 Vaccination Clinic  Name:  Zeynab Klett    MRN: [id number] DOB: 12/09/1995  01/09/2020  Ms. Sprowl was observed post Covid-19 immunization for 15 minutes without incident. She was provided with Vaccine Information Sheet and instruction to access the V-Safe system.   Ms. Jablon was instructed to call 911 with any severe reactions post vaccine: Marland Kitchen Difficulty breathing  . Swelling of face and throat  . A fast heartbeat  . A bad rash all over body  . Dizziness and weakness   Immunizations Administered    Name Date Dose VIS Date Route   Pfizer COVID-19 Vaccine 01/09/2020  9:05 AM 0.3 mL 09/05/2019 Intramuscular   Manufacturer: [employer], [employer]   Lot: [id number]   NDC: [address]-0

## 2020-01-31 ENCOUNTER — Other Ambulatory Visit: Payer: Self-pay

## 2020-01-31 ENCOUNTER — Ambulatory Visit: Payer: No Typology Code available for payment source | Attending: Internal Medicine

## 2020-01-31 DIAGNOSIS — Z23 Encounter for immunization: Secondary | ICD-10-CM

## 2020-01-31 NOTE — Progress Notes (Signed)
   Covid-19 Vaccination Clinic  Name:  Kristen Sawyer    MRN: [id number] DOB: 11-May-1996  01/31/2020  [name] was observed post Covid-19 immunization for 15 minutes without incident. She was provided with Vaccine Information Sheet and instruction to access the V-Safe system.   [name] was instructed to call 911 with any severe reactions post vaccine: Marland Kitchen Difficulty breathing  . Swelling of face and throat  . A fast heartbeat  . A bad rash all over body  . Dizziness and weakness   Immunizations Administered    Name Date Dose VIS Date Route   Pfizer COVID-19 Vaccine 01/31/2020  9:24 AM 0.3 mL 11/19/2018 Intramuscular   Manufacturer: [employer], [employer]   Lot: [id number]   NDC: [address]-5

## 2021-08-15 DIAGNOSIS — Z20822 Contact with and (suspected) exposure to covid-19: Secondary | ICD-10-CM | POA: Diagnosis not present

## 2021-08-15 DIAGNOSIS — R2243 Localized swelling, mass and lump, lower limb, bilateral: Secondary | ICD-10-CM | POA: Diagnosis not present

## 2021-08-15 DIAGNOSIS — O09623 Supervision of young multigravida, third trimester: Secondary | ICD-10-CM | POA: Insufficient documentation

## 2021-08-15 DIAGNOSIS — N938 Other specified abnormal uterine and vaginal bleeding: Secondary | ICD-10-CM | POA: Insufficient documentation

## 2021-08-15 DIAGNOSIS — Z3A35 35 weeks gestation of pregnancy: Secondary | ICD-10-CM | POA: Insufficient documentation

## 2021-08-16 ENCOUNTER — Encounter: Payer: Self-pay | Admitting: Emergency Medicine

## 2021-08-16 ENCOUNTER — Emergency Department: Payer: Medicaid Other

## 2021-08-16 ENCOUNTER — Emergency Department
Admission: EM | Admit: 2021-08-16 | Discharge: 2021-08-16 | Disposition: A | Discharge status: Home / Self Care | Payer: Medicaid Other | Attending: Emergency Medicine | Admitting: Emergency Medicine

## 2021-08-16 ENCOUNTER — Other Ambulatory Visit: Payer: Self-pay

## 2021-08-16 LAB — COMPREHENSIVE METABOLIC PANEL
ALT: 120 U/L — ABNORMAL HIGH (ref 0–44)
AST: 100 U/L — ABNORMAL HIGH (ref 15–41)
Albumin: 3.1 g/dL — ABNORMAL LOW (ref 3.5–5.0)
Alkaline Phosphatase: 79 U/L (ref 38–126)
Anion gap: 7 (ref 5–15)
BUN: 12 mg/dL (ref 6–20)
CO2: 23 mmol/L (ref 22–32)
Calcium: 8.4 mg/dL — ABNORMAL LOW (ref 8.9–10.3)
Chloride: 108 mmol/L (ref 98–111)
Creatinine, Ser: 0.48 mg/dL (ref 0.44–1.00)
GFR, Estimated: >60 mL/min (ref >60)
Glucose, Bld: 92 mg/dL (ref 70–99)
Potassium: 3.2 mmol/L — ABNORMAL LOW (ref 3.5–5.1)
Sodium: 138 mmol/L (ref 135–145)
Total Bilirubin: 0.7 mg/dL (ref 0.3–1.2)
Total Protein: 7.1 g/dL (ref 6.5–8.1)

## 2021-08-16 LAB — CBC WITH DIFFERENTIAL/PLATELET
Abs Immature Granulocytes: 0.03 10*3/uL (ref 0.00–0.07)
Basophils Absolute: 0 10*3/uL (ref 0.0–0.1)
Basophils Relative: 0 %
Eosinophils Absolute: 0.1 10*3/uL (ref 0.0–0.5)
Eosinophils Relative: 2 %
HCT: 35.4 % — ABNORMAL LOW (ref 36.0–46.0)
Hemoglobin: 11.4 g/dL — ABNORMAL LOW (ref 12.0–15.0)
Immature Granulocytes: 0 %
Lymphocytes Relative: 33 %
Lymphs Abs: 2.9 10*3/uL (ref 0.7–4.0)
MCH: 27.7 pg (ref 26.0–34.0)
MCHC: 32.2 g/dL (ref 30.0–36.0)
MCV: 85.9 fL (ref 80.0–100.0)
Monocytes Absolute: 0.4 10*3/uL (ref 0.1–1.0)
Monocytes Relative: 4 %
Neutro Abs: 5.4 10*3/uL (ref 1.7–7.7)
Neutrophils Relative %: 61 %
Platelets: 526 10*3/uL — ABNORMAL HIGH (ref 150–400)
RBC: 4.12 MIL/uL (ref 3.87–5.11)
RDW: 19.9 % — ABNORMAL HIGH (ref 11.5–15.5)
WBC: 8.9 10*3/uL (ref 4.0–10.5)
nRBC: 0 % (ref 0.0–0.2)

## 2021-08-16 LAB — RESP PANEL BY RT-PCR (FLU A&B, COVID) ARPGX2
Influenza A by PCR: NEGATIVE
Influenza B by PCR: NEGATIVE
SARS Coronavirus 2 by RT PCR: NEGATIVE

## 2021-08-16 LAB — LIPASE, BLOOD: Lipase: 33 U/L (ref 11–51)

## 2021-08-16 LAB — TYPE AND SCREEN
ABO/RH(D): A POS
Antibody Screen: NEGATIVE

## 2021-08-16 MED ORDER — SODIUM CHLORIDE 0.9 % IV BOLUS: 1000.0000 mL | Freq: Once | INTRAVENOUS | Status: DC

## 2021-08-16 NOTE — ED Notes (Signed)
(-)   POC Urine preg test . ERMD at bedside this time

## 2021-08-16 NOTE — ED Triage Notes (Signed)
Pt in with heavy bleeding, is 12 days postpartum delivery of healthy twins. States the past couple days have been heavier bleeding, and just PTA, she noticed strong cramping and a clot larger than golf ball. Had pre-eclampsia during pregnancy, otherwise healthy delivery. 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

## 2021-08-16 NOTE — Discharge Instructions (Signed)
The bleeding that you are having is likely normal postpartum bleeding.  Your ultrasound did show some fluid in your uterus which is unlikely to be retained products of conception given that your bleeding is slowing down and you do not have infection.  Your blood work was reassuring today.  Reasons to return to the emergency department would be if you are soaking through a pad more than once every 2 hours or you develop fevers or abdominal pain.

## 2021-08-16 NOTE — ED Notes (Signed)
ERMD at bedside w/ this RN for pelvic exam. Pt tolerated procedure well

## 2021-08-16 NOTE — ED Notes (Signed)
Called [facility]) for Sidney Ace, MD Gyn service

## 2021-08-16 NOTE — ED Provider Notes (Signed)
[facility]  ____________________________________________   Event Date/Time   First MD Initiated Contact with Patient 08/16/21 0222     (approximate)  I have reviewed the triage vital signs and the nursing notes.   HISTORY  Chief Complaint Vaginal Bleeding (12 days postpartum)    HPI Kristen Sawyer is a 25 y.o. female postpartum day 56 after a twin vaginal delivery during which her pregnancy was complicated by preeclampsia and now postpartum cardiomyopathy who presents with vaginal bleeding.  Patient delivered vaginally on 11/9 at [facility].  She had had some bleeding since delivering but then today noticed the bleeding was heavier and then prior to arrival she passed a large clot.  She denies lower abdominal cramping fevers or chills.  Since being in the emergency department her bleeding has slowed down somewhat.  She is not soaking through a pad more than once every 2 hours.  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  She denies any increasing shortness of breath and actually feels much improved from before no chest pain.  Denies lightheadedness or dizziness.         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 Authorizing Provider  Prenatal Vit-Fe Fumarate-FA (PRENATAL MULTIVITAMIN) TABS tablet Take 1 tablet by mouth daily at 12 noon.    [provider]    Allergies Patient has no known allergies.  No family history on file.  Social  History Social History   Tobacco Use   Smoking status: Never   Smokeless tobacco: Never  Substance Use Topics   Alcohol use: No   Drug use: No    Review of Systems   Review of Systems  Constitutional:  Negative for chills and fever.  Respiratory:  Negative for shortness of breath.   Cardiovascular:  Positive for leg swelling. Negative for chest pain.  Gastrointestinal:  Negative for abdominal pain, nausea and vomiting.  Genitourinary:  Positive for vaginal bleeding. Negative for dysuria and vaginal discharge.  All other systems reviewed and are negative.  Physical Exam Updated Vital Signs BP 113/80 (BP Location: Left Arm)   Pulse (!) 109   Temp 98.7 F (37.1 C)   Resp 16   Wt 88 kg   SpO2 99%   BMI 37.89 kg/m   Physical Exam Vitals and nursing note reviewed.  Constitutional:      General: She is not in acute distress.    Appearance: Normal appearance.  HENT:     Head: Normocephalic and atraumatic.  Eyes:     General: No scleral icterus.    Conjunctiva/sclera: Conjunctivae normal.  Pulmonary:     Effort: Pulmonary effort is normal. No respiratory distress.     Breath sounds: No stridor.  Abdominal:     General: Abdomen is flat. There is no distension.     Palpations: Abdomen is soft.     Tenderness: There is no abdominal tenderness.  Genitourinary:    Comments: Old blood in the  vaginal vault, no active hemorrhage Musculoskeletal:        General: No deformity or signs of injury.     Cervical back: Normal range of motion.     Right lower leg: Edema present.     Left lower leg: Edema present.     Comments: 1+ lower extremity edema bilaterally  Skin:    General: Skin is dry.     Coloration: Skin is not jaundiced or pale.  Neurological:     General: No focal deficit present.     Mental Status: She is alert and oriented to person, place, and time. Mental status is at baseline.  Psychiatric:        Mood and Affect: Mood normal.        Behavior: Behavior  normal.     LABS (all labs ordered are listed, but only abnormal results are displayed)  Labs Reviewed  CBC WITH DIFFERENTIAL/PLATELET - Abnormal; Notable for the following components:      Result Value   Hemoglobin 11.4 (*)    HCT 35.4 (*)    RDW 19.9 (*)    Platelets 526 (*)    All other components within normal limits  COMPREHENSIVE METABOLIC PANEL - Abnormal; Notable for the following components:   Potassium 3.2 (*)    Calcium 8.4 (*)    Albumin 3.1 (*)    AST 100 (*)    ALT 120 (*)    All other components within normal limits  RESP PANEL BY RT-PCR (FLU A&B, COVID) ARPGX2  LIPASE, BLOOD  POC URINE PREG, ED  TYPE AND SCREEN   ____________________________________________  EKG  N/a ____________________________________________  RADIOLOGY Ky Barban, personally viewed and evaluated these images (plain radiographs) as part of my medical decision making, as well as reviewing the written report by the radiologist.  ED MD interpretation: I reviewed the ultrasound of the pelvis which shows hypoechoic material likely blood in the uterus    ____________________________________________   PROCEDURES  Procedure(s) performed (including Critical Care):  Procedures   ____________________________________________   INITIAL IMPRESSION / ASSESSMENT AND PLAN / ED COURSE     Patient is a 25 year old female about 12 days postpartum after twin delivery at 35 weeks who presents with vaginal bleeding.  Has had some normal vaginal bleeding since her very on 11/9 which then increase in heaviness today and she then passed a large clot.  Patient also with recent diagnosis of peripartum cardiomyopathy however from a respiratory standpoint she is doing well.  On exam she has somewhat tachycardic but overall well-appearing abdomen is benign nontender.  There is no blood in the vaginal vault but no active hemorrhage.  Her hemoglobin is stable from prior 0.4.  She has no  leukocytosis.  AST and ALT are elevated as well and looking back at her prior labs in Care Everywhere they are actually downtrending.  Obtained a pelvic ultrasound which shows normal-sized uterus after pregnancy and hypoechoic complex fluid in the uterus which is likely blood.  No internal vascularity.  Discussed with [facility] OB[facility]GYN on-call who notes that given she is not hemorrhaging and has no signs of infection that it is unlikely to be retained products of conception at this point, ultrasound can sometimes be difficult to interpret but clinically does not seem consistent with this.  He recommended discharge with outpatient follow-up.  Given the patient's reassuring work-up and hemodynamic status and minimal bleeding on my exam I feel comfortable with this plan.  Discussed with the patient return  precautions for heavy bleeding and signs of infection.  Clinical Course as of 08/16/21 0535  Tue Aug 16, 2021  0505 IMPRESSION: 1. Complex fluid in the endometrial cavity measuring up to 2 cm in thickness. 2. 2.8 cm thick endometrium without focal or worrisome hypervascularity.   [KM]    Clinical Course User Index [KM] Georga Hacking, MD     ____________________________________________   FINAL CLINICAL IMPRESSION(S) / ED DIAGNOSES  Final diagnoses:  Postpartum hemorrhage, unspecified type     ED Discharge Orders     None        Note:  This document was prepared using Dragon voice recognition software and may include unintentional dictation errors.    Georga Hacking, MD 08/16/21 [phone]
